# Patient Record
Sex: Female | Born: 1995 | Race: White | Hispanic: No | Marital: Single | Smoking: Current every day smoker
Health system: Southern US, Community
[De-identification: ages and names within clinical notes are randomized; demographics above are authoritative.]

## PROBLEM LIST (undated history)

## (undated) ENCOUNTER — Ambulatory Visit (HOSPITAL_COMMUNITY): Admission: EM | Source: Home / Self Care

## (undated) DIAGNOSIS — F1920 Other psychoactive substance dependence, uncomplicated: Secondary | ICD-10-CM

## (undated) DIAGNOSIS — K219 Gastro-esophageal reflux disease without esophagitis: Secondary | ICD-10-CM

## (undated) DIAGNOSIS — J45909 Unspecified asthma, uncomplicated: Secondary | ICD-10-CM

## (undated) HISTORY — PX: TONSILLECTOMY: SUR1361

---

## 2018-05-29 ENCOUNTER — Encounter (HOSPITAL_COMMUNITY): Payer: Self-pay | Admitting: *Deleted

## 2018-05-29 ENCOUNTER — Other Ambulatory Visit: Payer: Self-pay

## 2018-05-29 ENCOUNTER — Emergency Department (HOSPITAL_COMMUNITY)
Admission: EM | Admit: 2018-05-29 | Discharge: 2018-05-29 | Disposition: A | Discharge status: Home / Self Care | Payer: Self-pay | Attending: Emergency Medicine | Admitting: Emergency Medicine

## 2018-05-29 DIAGNOSIS — L02415 Cutaneous abscess of right lower limb: Secondary | ICD-10-CM | POA: Insufficient documentation

## 2018-05-29 DIAGNOSIS — L0291 Cutaneous abscess, unspecified: Secondary | ICD-10-CM

## 2018-05-29 DIAGNOSIS — F17210 Nicotine dependence, cigarettes, uncomplicated: Secondary | ICD-10-CM | POA: Insufficient documentation

## 2018-05-29 DIAGNOSIS — Z8709 Personal history of other diseases of the respiratory system: Secondary | ICD-10-CM | POA: Insufficient documentation

## 2018-05-29 HISTORY — DX: Unspecified asthma, uncomplicated: J45.909

## 2018-05-29 MED ORDER — SULFAMETHOXAZOLE-TRIMETHOPRIM 800-160 MG PO TABS: 1.0000 | ORAL_TABLET | Freq: Two times a day (BID) | ORAL | 0 refills | Status: AC

## 2018-05-29 MED ORDER — LIDOCAINE HCL 2 % IJ SOLN
5.0000 mL | Freq: Once | INTRAMUSCULAR | Status: AC
  Administered 2018-05-29: 100 mg
  Filled 2018-05-29: qty 20

## 2018-05-29 MED ORDER — BACITRACIN ZINC 500 UNIT/GM EX OINT
TOPICAL_OINTMENT | Freq: Once | CUTANEOUS | Status: AC
  Administered 2018-05-29: 16:00:00 via TOPICAL

## 2018-05-29 NOTE — ED Notes (Signed)
Patient verbalizes understanding of discharge instructions. Opportunity for questioning and answers were provided. Armband removed by staff, pt discharged from ED.  

## 2018-05-29 NOTE — ED Triage Notes (Signed)
PT reports site in RT lateral thigh started as a little pimple and continues to get bigger. Surrounding skin red and warm to touch. Pt reports a hx of skin infections but also thinks  It could be a spider bite.

## 2018-05-29 NOTE — ED Provider Notes (Signed)
[facility] EMERGENCY DEPARTMENT Provider Note   CSN: [id number] Arrival date & time: 05/29/18  1432    History   Chief Complaint Chief Complaint  Patient presents with  . Recurrent Skin Infections    HPI Margaret Mendoza is a 23 y.o. female.     The history is provided by the patient and medical records. No language interpreter was used.   Margaret Mendoza is a 23 y.o. female who presents to the Emergency Department complaining of area of redness to her right upper thigh.  She states this began as a little pimple about a week ago.  She did try to squeeze it, but not very much came out.  Over the last 2 to 3 days, the area has been progressively worsening.  She now feels like it is much more swollen and the redness around the site has grown as well.  Per triage note, patient states she thinks it could be a spider bite.  She did not say this to me.  She states that she never felt a bite or saw any insects, but when asked if it could have been a bite, she said maybe.  She believes this really began as a pimple.  She denies any fever.  She did get a tattoo to the area about a month ago.  It has been healing well without any complication.  She feels as if everything was going as expected and as had occurred with her previous tattoos.  She does report a history of skin infections and had to be hospitalized once for a abscess on her breast.  She states this was in [place].  She has had multiple abscesses on her breast and a similar area drained prior to this hospitalization as well.  She has never had an abscess in this area on her thigh before.  She has not taken any medication prior to arrival for her symptoms.  She reports being allergic to penicillins with no other allergies.  Unsure if she has had cephalosporins in the past.  Past Medical History:  Diagnosis Date  . Asthma     There are no active problems to display for this patient.   History reviewed. No pertinent surgical  history.   OB History   No obstetric history on file.      Home Medications    Prior to Admission medications   Medication Sig Start Date End Date Taking? Authorizing Provider  sulfamethoxazole-trimethoprim (BACTRIM DS) 800-160 MG tablet Take 1 tablet by mouth 2 (two) times daily for 7 days. 05/29/18 06/05/18  , Chase Picket, PA-C    Family History History reviewed. No pertinent family history.  Social History Social History   Tobacco Use  . Smoking status: Current Every Day Smoker    Packs/day: 1.00    Types: Cigarettes  . Smokeless tobacco: Never Used  Substance Use Topics  . Alcohol use: Yes    Comment: Drinks 1 time a week  . Drug use: Not Currently     Allergies   Penicillins   Review of Systems Review of Systems  Constitutional: Negative for fever.  HENT: Negative for congestion.   Respiratory: Negative for shortness of breath.   Cardiovascular: Negative for chest pain and leg swelling.  Musculoskeletal: Positive for myalgias.  Skin: Positive for color change and wound.  Neurological: Negative for weakness and numbness.     Physical Exam Updated Vital Signs BP (!) 125/92 (BP Location: Right Arm)   Pulse (!) 107  Temp 98.2 F (36.8 C) (Oral)   Resp 20   Ht 5\' 1"  (1.549 m)   LMP 05/13/2018   SpO2 99%   Physical Exam Vitals signs and nursing note reviewed.  Constitutional:      General: She is not in acute distress.    Appearance: She is well-developed.  HENT:     Head: Normocephalic and atraumatic.  Neck:     Musculoskeletal: Neck supple.  Cardiovascular:     Heart sounds: Normal heart sounds. No murmur.     Comments: Regular rate and rhythm on exam. Pulmonary:     Effort: Pulmonary effort is normal. No respiratory distress.     Breath sounds: Normal breath sounds. No wheezing or rales.  Musculoskeletal: Normal range of motion.  Skin:    General: Skin is warm and dry.     Comments: Right thigh with 1.5x1.5 cm area of induration  which is tender to the touch.  Does feel somewhat fluctuant.  There is about 1 cm of surrounding erythema as well.  No active drainage.  Neurological:     Mental Status: She is alert.      ED Treatments / Results  Labs (all labs ordered are listed, but only abnormal results are displayed) Labs Reviewed - No data to display  EKG None  Radiology No results found.  Procedures .Marland KitchenIncision and Drainage Date/Time: 05/29/2018 3:34 PM Performed by: , Chase Picket, PA-C Authorized by: , Chase Picket, PA-C   Consent:    Consent obtained:  Verbal   Consent given by:  Patient   Risks discussed:  Bleeding, incomplete drainage, pain and infection Location:    Type:  Abscess   Size:  1.5x1.5cm   Location:  Lower extremity   Lower extremity location:  Leg   Leg location:  R upper leg Pre-procedure details:    Skin preparation:  Betadine Anesthesia (see MAR for exact dosages):    Anesthesia method:  Local infiltration   Local anesthetic:  Lidocaine 2% w/o epi Procedure type:    Complexity:  Simple Procedure details:    Incision types:  Single straight   Scalpel blade:  11   Wound management:  Irrigated with saline   Drainage:  Purulent   Drainage amount:  Moderate   Packing materials:  None Post-procedure details:    Patient tolerance of procedure:  Tolerated well, no immediate complications   (including critical care time)  Medications Ordered in ED Medications  lidocaine (XYLOCAINE) 2 % (with pres) injection 100 mg (has no administration in time range)  bacitracin ointment (has no administration in time range)     Initial Impression / Assessment and Plan / ED Course  I have reviewed the triage vital signs and the nursing notes.  Pertinent labs & imaging results that were available during my care of the patient were reviewed by me and considered in my medical decision making (see chart for details).        Margaret Mendoza is a 23 y.o. female who presents to ED  for abscess requiring incision and drainage. Area is quite small, however even though it is small, feel she would benefit from getting it opened given her history of abscess requiring hospital admission in the past. I&D performed per procedure note above. Did get a fair amount of purulent discharge out.  Patient tolerated the procedure well.  Patient was prescribed Bactrim. Wound care instructions discussed. Wound check in 2-3 days. Return to ER if concern for spread of infection, increasing pain,  fevers or other concerns. All questions answered.  Final Clinical Impressions(s) / ED Diagnoses   Final diagnoses:  Abscess    ED Discharge Orders         Ordered    sulfamethoxazole-trimethoprim (BACTRIM DS) 800-160 MG tablet  2 times daily     05/29/18 1532           , Chase PicketJaime Pilcher, PA-C 05/29/18 1536    Derwood KaplanNanavati, Ankit, MD 05/31/18 2135

## 2018-05-29 NOTE — Discharge Instructions (Signed)
It was my pleasure taking care of you today!   Please take all of your antibiotics until finished. Wash wound with soap and water twice daily.   Return to the emergency department if you develop a fever, new or worsening symptoms develop, any additional concerns.

## 2018-07-31 ENCOUNTER — Other Ambulatory Visit: Payer: Self-pay

## 2018-07-31 ENCOUNTER — Emergency Department
Admission: EM | Admit: 2018-07-31 | Discharge: 2018-07-31 | Disposition: A | Discharge status: Home / Self Care | Payer: Self-pay | Attending: Emergency Medicine | Admitting: Emergency Medicine

## 2018-07-31 DIAGNOSIS — F17210 Nicotine dependence, cigarettes, uncomplicated: Secondary | ICD-10-CM | POA: Insufficient documentation

## 2018-07-31 DIAGNOSIS — R509 Fever, unspecified: Secondary | ICD-10-CM | POA: Insufficient documentation

## 2018-07-31 DIAGNOSIS — Z20828 Contact with and (suspected) exposure to other viral communicable diseases: Secondary | ICD-10-CM | POA: Insufficient documentation

## 2018-07-31 DIAGNOSIS — J45909 Unspecified asthma, uncomplicated: Secondary | ICD-10-CM | POA: Insufficient documentation

## 2018-07-31 LAB — SARS CORONAVIRUS 2 BY RT PCR (HOSPITAL ORDER, PERFORMED IN ~~LOC~~ HOSPITAL LAB): SARS Coronavirus 2: NEGATIVE

## 2018-07-31 NOTE — ED Notes (Addendum)
Pt states 1 week ago she had fever of 101.3 when checked at work.

## 2018-07-31 NOTE — ED Notes (Signed)
DC instructions discussed with patient, advised to follow up for results. Informed patient to sign up for MyChart to get results faster.

## 2018-07-31 NOTE — ED Provider Notes (Signed)
[facility] Emergency Department Provider Note __   First MD Initiated Contact with Patient 07/31/18 1528     (approximate)  I have reviewed the triage vital signs and the nursing notes.   HISTORY  Chief Complaint Needs Work Note Only    HPI Margaret Mendoza is a 23 y.o. female presents to the emergency department stating that she had a fever at work 1 week ago and as such was sent home.  Patient states that she checked her temperature with 2 different thermometers that she has at home after being told as such and both did not reveal a fever.  Patient denies any symptoms no cough shortness of breath diarrhea abdominal discomfort or any other complaints.  Patient states that her job would not have her return to work without a doctor's note which is why she presented to the emergency department today        Past Medical History:  Diagnosis Date  . Asthma     There are no active problems to display for this patient.   No past surgical history on file.  Prior to Admission medications   Not on File    Allergies Penicillins  No family history on file.  Social History Social History   Tobacco Use  . Smoking status: Current Every Day Smoker    Packs/day: 1.00    Types: Cigarettes  . Smokeless tobacco: Never Used  Substance Use Topics  . Alcohol use: Yes    Comment: Drinks 1 time a week  . Drug use: Not Currently    Review of Systems Constitutional: No fever/chills Eyes: No visual changes. ENT: No sore throat. Cardiovascular: Denies chest pain. Respiratory: Denies shortness of breath. Gastrointestinal: No abdominal pain.  No nausea, no vomiting.  No diarrhea.  No constipation. Genitourinary: Negative for dysuria. Musculoskeletal: Negative for neck pain.  Negative for back pain. Integumentary: Negative for rash. Neurological: Negative for headaches, focal weakness or numbness.   ____________________________________________   PHYSICAL  EXAM:  VITAL SIGNS: ED Triage Vitals  Enc Vitals Group     BP 07/31/18 1504 122/79     Pulse Rate 07/31/18 1504 87     Resp 07/31/18 1504 18     Temp 07/31/18 1504 98 F (36.7 C)     Temp Source 07/31/18 1504 Oral     SpO2 07/31/18 1504 96 %     Weight --      Height --      Head Circumference --      Peak Flow --      Pain Score 07/31/18 1508 0     Pain Loc --      Pain Edu? --      Excl. in [place]? --     Constitutional: Alert and oriented. Well appearing and in no acute distress. Eyes: Conjunctivae are normal. Mouth/Throat: Mucous membranes are moist. Oropharynx non-erythematous. Neck: No stridor.   Cardiovascular: Normal rate, regular rhythm. Good peripheral circulation. Grossly normal heart sounds. Respiratory: Normal respiratory effort.  No retractions. No audible wheezing. Musculoskeletal: No lower extremity tenderness nor edema. No gross deformities of extremities. Neurologic:  Normal speech and language. No gross focal neurologic deficits are appreciated.  Skin:  Skin is warm, dry and intact. No rash noted. Psychiatric: Mood and affect are normal. Speech and behavior are normal.  ____________________________________________   LABS (all labs ordered are listed, but only abnormal results are displayed)  Labs Reviewed  [facility]ONAVIR[facility] 2 ([facility])    Procedures   ____________________________________________   INITIAL IMPRESSION / MDM / ASSESSMENT AND PLAN / ED COURSE  As part of my medical decision making, I reviewed the following data within the electronic MEDICAL RECORD NUMBER      ____________________________________________  FINAL CLINICAL IMPRESSION(S) / ED DIAGNOSES  Final diagnoses:  Fever, unspecified fever cause     MEDICATIONS GIVEN DURING THIS VISIT:  Medications - No data to display   ED Discharge Orders    None      *Please note:  Margaret Mendoza was evaluated in Emergency Department on  07/31/2018 for the symptoms described in the history of present illness. She was evaluated in the context of the global COVID-19 pandemic, which necessitated consideration that the patient might be at risk for infection with the SARS-CoV-2 virus that causes COVID-19. Institutional protocols and algorithms that pertain to the evaluation of patients at risk for COVID-19 are in a state of rapid change based on information released by regulatory bodies including the CDC and federal and state organizations. These policies and algorithms were followed during the patient's care in the ED.  Some ED evaluations and interventions may be delayed as a result of limited staffing during the pandemic.*  Note:  This document was prepared using Dragon voice recognition software and may include unintentional dictation errors.   Darci CurrentBrown, [place], MD 07/31/18 1650

## 2018-07-31 NOTE — ED Triage Notes (Signed)
PT arrived via POV with reports of needing work note.  Pt states she cannot go back to work without a release stating she does not have a fever.

## 2019-03-25 ENCOUNTER — Emergency Department: Payer: Self-pay

## 2019-03-25 ENCOUNTER — Encounter: Payer: Self-pay | Admitting: Emergency Medicine

## 2019-03-25 ENCOUNTER — Other Ambulatory Visit: Payer: Self-pay

## 2019-03-25 ENCOUNTER — Emergency Department
Admission: EM | Admit: 2019-03-25 | Discharge: 2019-03-25 | Disposition: A | Discharge status: Home / Self Care | Payer: Self-pay | Attending: Student | Admitting: Student

## 2019-03-25 DIAGNOSIS — F17210 Nicotine dependence, cigarettes, uncomplicated: Secondary | ICD-10-CM | POA: Insufficient documentation

## 2019-03-25 DIAGNOSIS — J45909 Unspecified asthma, uncomplicated: Secondary | ICD-10-CM | POA: Insufficient documentation

## 2019-03-25 DIAGNOSIS — K047 Periapical abscess without sinus: Secondary | ICD-10-CM | POA: Insufficient documentation

## 2019-03-25 LAB — BASIC METABOLIC PANEL
Anion gap: 8 (ref 5–15)
BUN: 14 mg/dL (ref 6–20)
CO2: 26 mmol/L (ref 22–32)
Calcium: 8.6 mg/dL — ABNORMAL LOW (ref 8.9–10.3)
Chloride: 104 mmol/L (ref 98–111)
Creatinine, Ser: 0.67 mg/dL (ref 0.44–1.00)
GFR calc Af Amer: >60 mL/min (ref >60)
GFR calc non Af Amer: >60 mL/min (ref >60)
Glucose, Bld: 94 mg/dL (ref 70–99)
Potassium: 3.9 mmol/L (ref 3.5–5.1)
Sodium: 138 mmol/L (ref 135–145)

## 2019-03-25 LAB — CBC WITH DIFFERENTIAL/PLATELET
Abs Immature Granulocytes: 0.01 10*3/uL (ref 0.00–0.07)
Basophils Absolute: 0.1 10*3/uL (ref 0.0–0.1)
Basophils Relative: 1 %
Eosinophils Absolute: 0.1 10*3/uL (ref 0.0–0.5)
Eosinophils Relative: 2 %
HCT: 34.7 % — ABNORMAL LOW (ref 36.0–46.0)
Hemoglobin: 11.7 g/dL — ABNORMAL LOW (ref 12.0–15.0)
Immature Granulocytes: 0 %
Lymphocytes Relative: 20 %
Lymphs Abs: 1.8 10*3/uL (ref 0.7–4.0)
MCH: 30.4 pg (ref 26.0–34.0)
MCHC: 33.7 g/dL (ref 30.0–36.0)
MCV: 90.1 fL (ref 80.0–100.0)
Monocytes Absolute: 0.8 10*3/uL (ref 0.1–1.0)
Monocytes Relative: 9 %
Neutro Abs: 6.4 10*3/uL (ref 1.7–7.7)
Neutrophils Relative %: 68 %
Platelets: 311 10*3/uL (ref 150–400)
RBC: 3.85 MIL/uL — ABNORMAL LOW (ref 3.87–5.11)
RDW: 12.3 % (ref 11.5–15.5)
WBC: 9.2 10*3/uL (ref 4.0–10.5)
nRBC: 0 % (ref 0.0–0.2)

## 2019-03-25 IMAGING — CT CT MAXILLOFACIAL W/ CM
3 series · 15 of 47 positions shown, 18 images · IV contrast (omnipaque)
Comparison: None.

CLINICAL DATA: Left-sided facial swelling for 4 days. Soft tissue
swelling is increasing and now involves the orbit.

EXAM:
CT MAXILLOFACIAL WITH CONTRAST
TECHNIQUE: Multidetector CT imaging of the maxillofacial structures was
performed with intravenous contrast. Multiplanar CT image
reconstructions were also generated.
CONTRAST:  75mL OMNIPAQUE IOHEXOL 300 MG/ML  SOLN

[Series 2: max soft · axial · 0.31mm/px · z∈[-276,-164]mm · 9 of 66 slices shown, 12 images]
[im 5/66  brain]
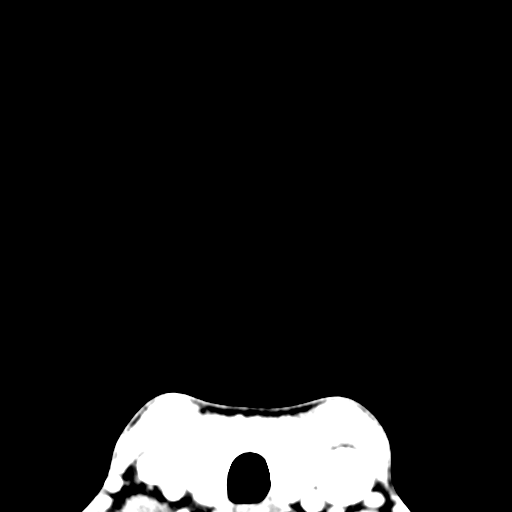
[im 5/66  bone]
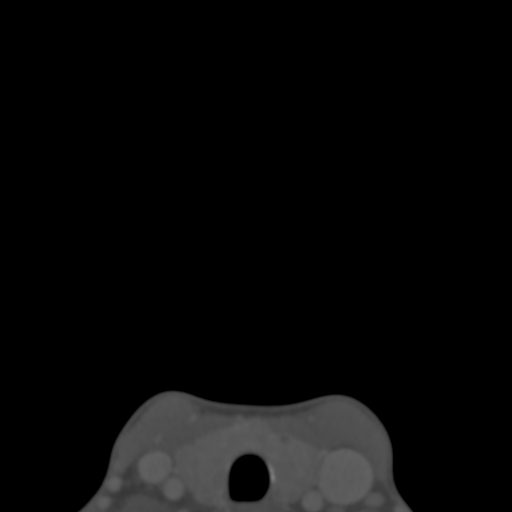
[im 12/66  bone]
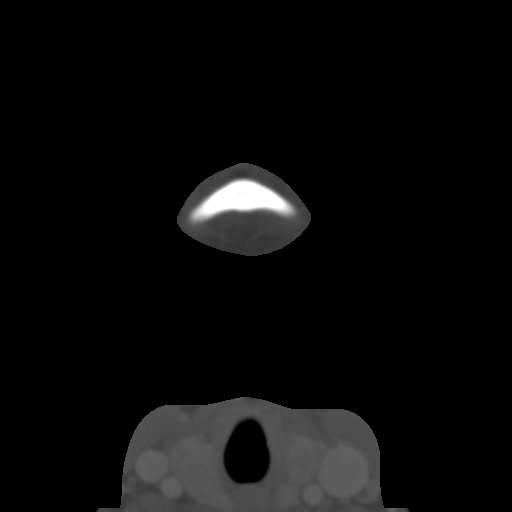
[im 18/66  bone]
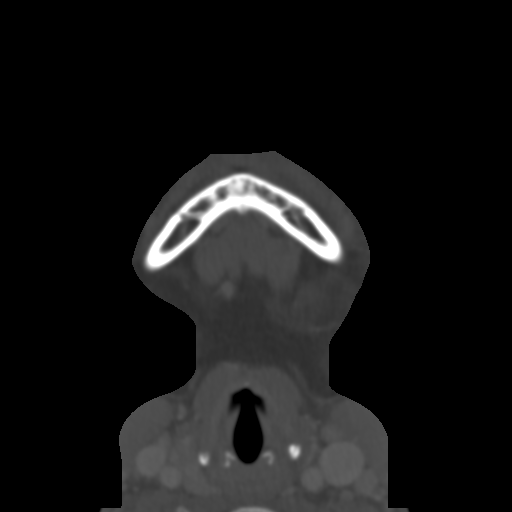
[im 25/66  bone]
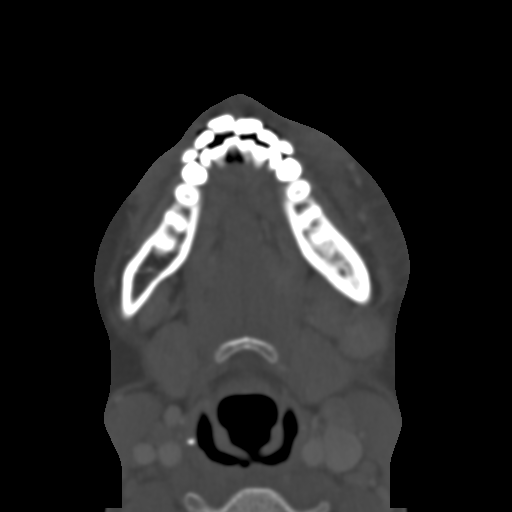
[im 34/66  brain]
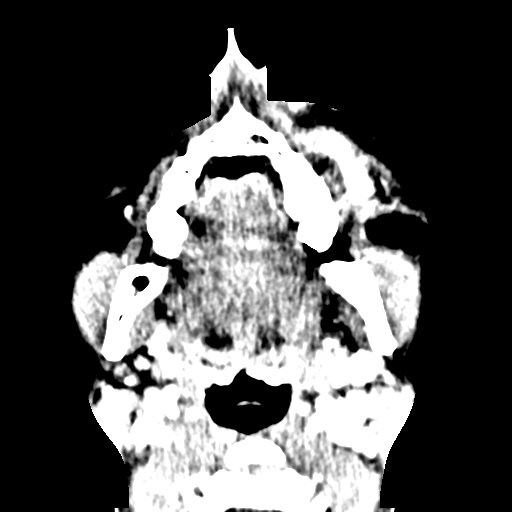
[im 34/66  bone]
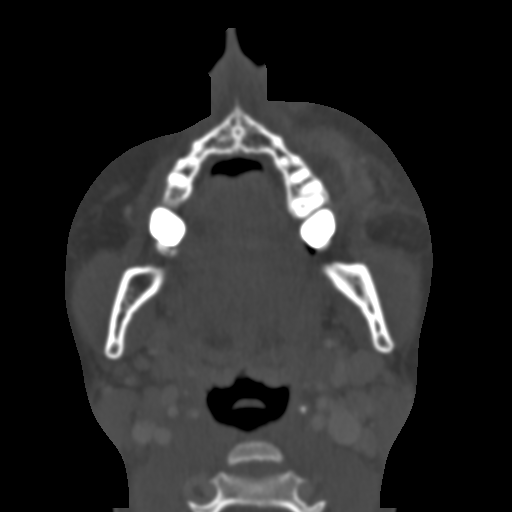
[im 41/66  bone]
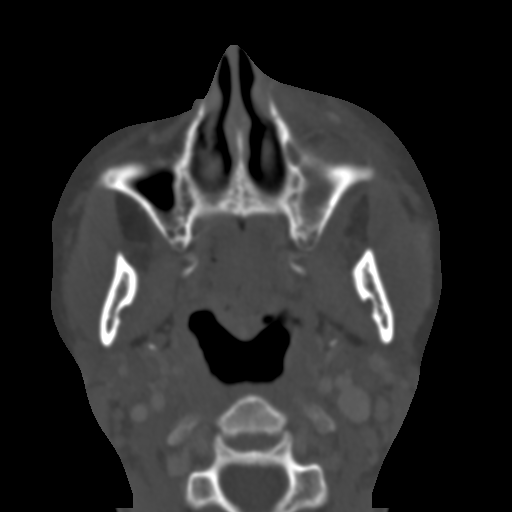
[im 48/66  bone]
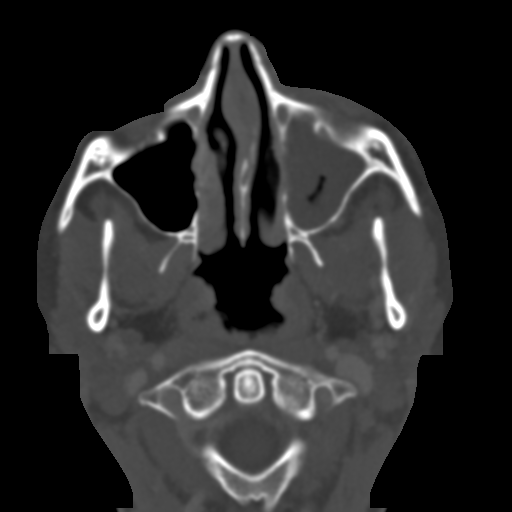
[im 54/66  bone]
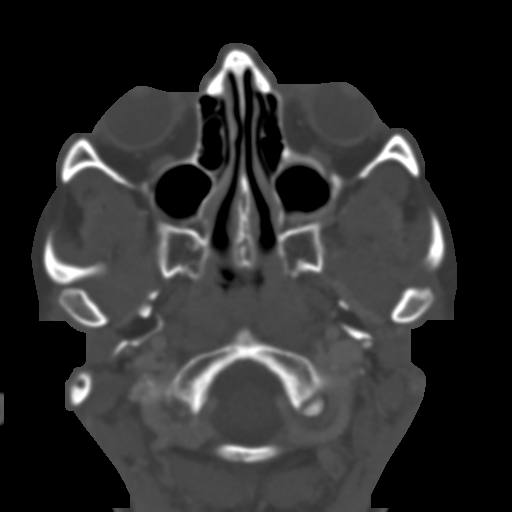
[im 61/66  brain]
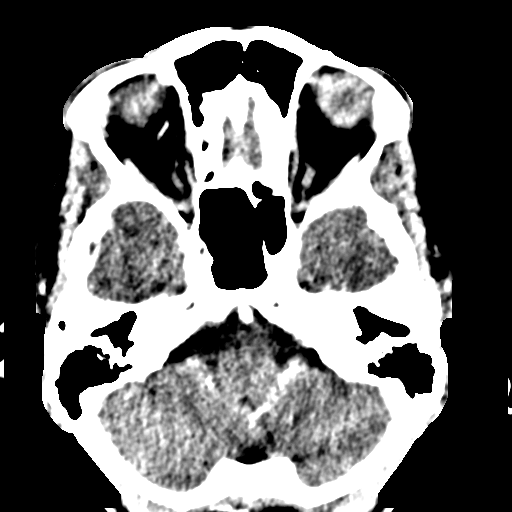
[im 61/66  bone]
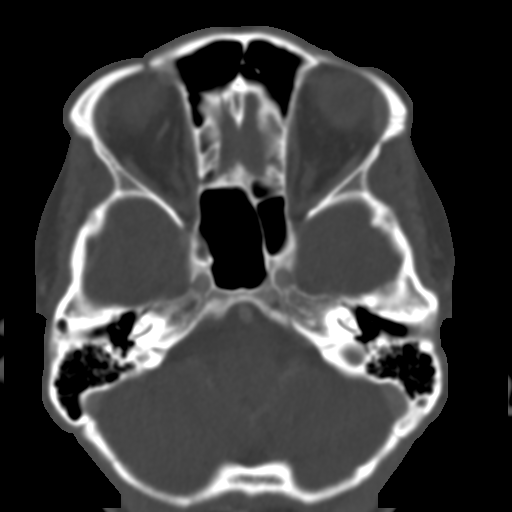

[Series 6: coronal soft · coronal · 0.27mm/px · 3 of 67 slices shown]
[im 23/67  bone]
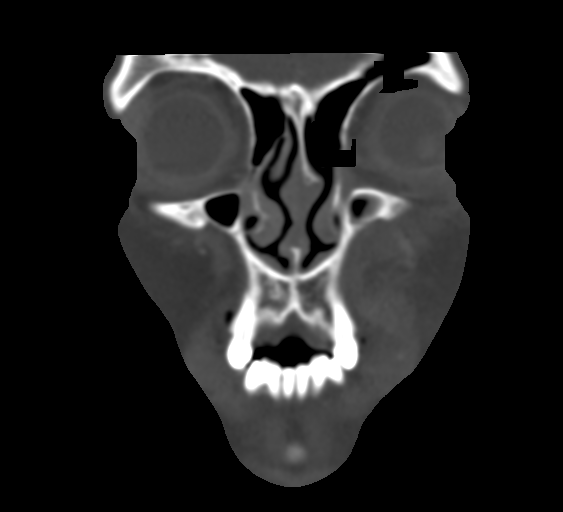
[im 30/67  bone]
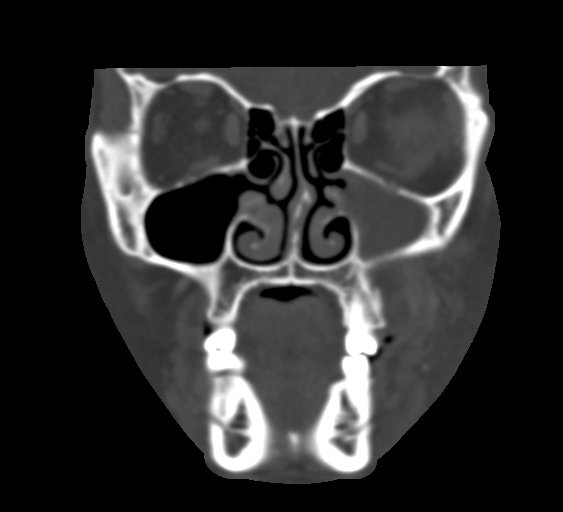
[im 37/67  bone]
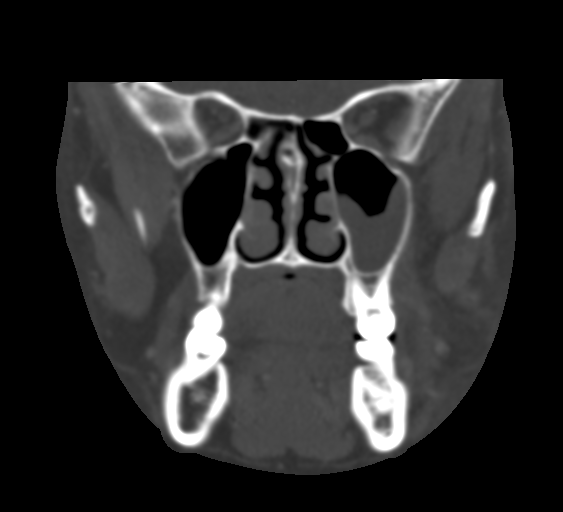

[Series 7: sagittal soft · sagittal · 0.26mm/px · 3 of 76 slices shown]
[im 26/76  bone]
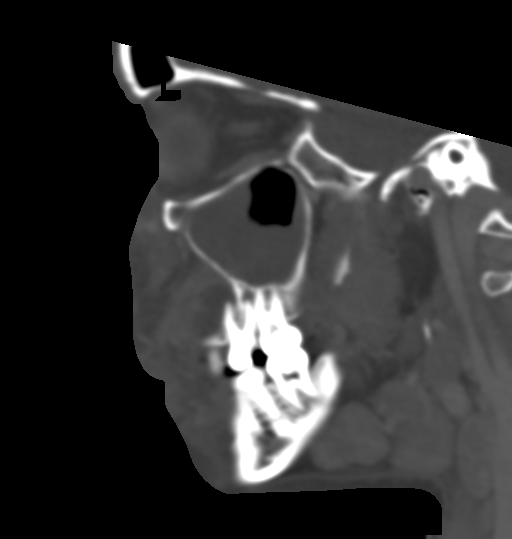
[im 38/76  bone]
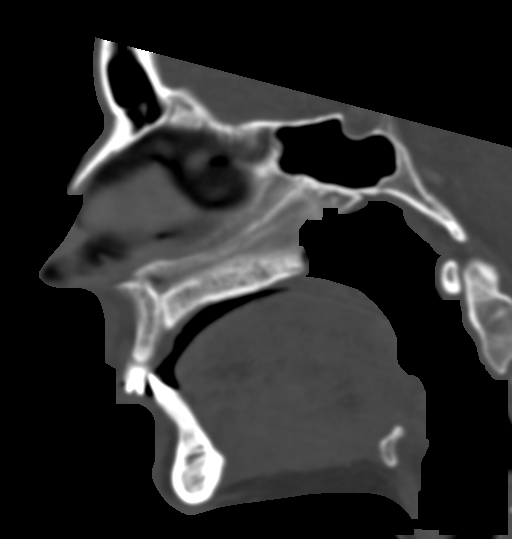
[im 51/76  bone]
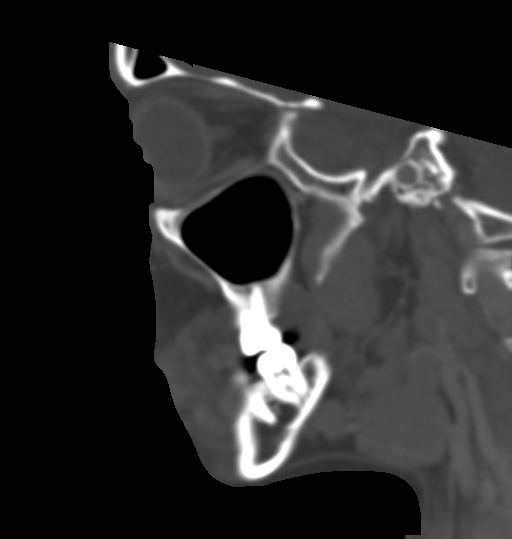

[15 of 47 positions shown; findings below may reference images not displayed]

FINDINGS: Osseous: No acute or healing fractures are present. Prominent dental
caries is present in the posterior aspect of the first left
maxillary molar. Periapical lucencies are present about both roots
with extension through the lateral cortex. Periapical lucency is
noted about the roots of the second left maxillary molar.

Orbits: Left periorbital soft tissue swelling is noted inferiorly.
No postseptal swelling is present. Globes and orbits are otherwise
within normal limits.

Sinuses: Circumferential mucosal thickening is present in the left
maxillary sinus. No fluid levels are present. The visualized
paranasal sinuses are otherwise clear.

Soft tissues: A left subperiosteal abscess is associated with the
left maxilla. The collection measures 10 x 16 x 18 mm. Extensive
surrounding soft tissue swelling is noted. This extends from the
angle the mandible superiorly to the orbit. No other discrete fluid
collection is present. Right-sided facial soft tissues are
unremarkable.

Limited intracranial: Within normal limits.
IMPRESSION: 1. Prominent dental caries involving the left first maxillary molar.
2. Associated periapical abscess with extension through the lateral
cortex of the maxilla.
3. Adjacent subperiosteal abscess measures 10 x 16 x 18 mm.
4. Diffuse surrounding soft tissue edema extending from the angle
the mandible to the inferior orbit.
5. No postseptal disease of the orbit.

## 2019-03-25 MED ORDER — CLINDAMYCIN HCL 150 MG PO CAPS: 300.0000 mg | ORAL_CAPSULE | Freq: Three times a day (TID) | ORAL | 0 refills | Status: DC

## 2019-03-25 MED ORDER — TRAMADOL HCL 50 MG PO TABS: 50.0000 mg | ORAL_TABLET | Freq: Four times a day (QID) | ORAL | 0 refills | Status: DC | PRN

## 2019-03-25 MED ORDER — SODIUM CHLORIDE 0.9 % IV BOLUS
1000.0000 mL | Freq: Once | INTRAVENOUS | Status: AC
  Administered 2019-03-25: 19:00:00 1000 mL via INTRAVENOUS

## 2019-03-25 MED ORDER — MORPHINE SULFATE (PF) 4 MG/ML IV SOLN
4.0000 mg | Freq: Once | INTRAVENOUS | Status: AC
  Administered 2019-03-25: 4 mg via INTRAVENOUS
  Filled 2019-03-25: qty 1

## 2019-03-25 MED ORDER — ONDANSETRON HCL 4 MG/2ML IJ SOLN
4.0000 mg | Freq: Once | INTRAMUSCULAR | Status: AC
  Administered 2019-03-25: 19:00:00 4 mg via INTRAVENOUS
  Filled 2019-03-25: qty 2

## 2019-03-25 MED ORDER — IOHEXOL 300 MG/ML  SOLN
75.0000 mL | Freq: Once | INTRAMUSCULAR | Status: AC | PRN
  Administered 2019-03-25: 17:00:00 75 mL via INTRAVENOUS
  Filled 2019-03-25: qty 75

## 2019-03-25 MED ORDER — CLINDAMYCIN PHOSPHATE 600 MG/50ML IV SOLN
600.0000 mg | Freq: Once | INTRAVENOUS | Status: AC
  Administered 2019-03-25: 600 mg via INTRAVENOUS
  Filled 2019-03-25: qty 50

## 2019-03-25 NOTE — ED Provider Notes (Signed)
[facility] Emergency Department Provider Note  ____________________________________________   First MD Initiated Contact with Patient 03/25/19 1535     (approximate)  I have reviewed the triage vital signs and the nursing notes.   HISTORY  Chief Complaint Facial Swelling    HPI Margaret Mendoza is a 24 y.o. female presents emergency department complaining of left-sided facial swelling that started 4 days ago.  She states the feeling in one of her left upper molars fell out last week.  She awoke today with more swelling and some pus draining from the left eye.  She denies any fever or chills.  No neck pain.    Past Medical History:  Diagnosis Date  . Asthma     There are no problems to display for this patient.   History reviewed. No pertinent surgical history.  Prior to Admission medications   Medication Sig Start Date End Date Taking? Authorizing Provider  clindamycin (CLEOCIN) 150 MG capsule Take 2 capsules (300 mg total) by mouth 3 (three) times daily. 03/25/19   Amear Strojny, Roselyn Bering, PA-C  traMADol (ULTRAM) 50 MG tablet Take 1 tablet (50 mg total) by mouth every 6 (six) hours as needed. 03/25/19   Sherrie Mustache Roselyn Bering, PA-C    Allergies Penicillins and Clindamycin/lincomycin  History reviewed. No pertinent family history.  Social History Social History   Tobacco Use  . Smoking status: Current Every Day Smoker    Packs/day: 1.00    Types: Cigarettes  . Smokeless tobacco: Never Used  Substance Use Topics  . Alcohol use: Yes    Comment: Drinks 1 time a week  . Drug use: Not Currently    Review of Systems  Constitutional: No fever/chills Eyes: No visual changes. ENT: No sore throat.  Positive for left-sided facial swelling and tooth pain Respiratory: Denies cough Cardiovascular: Denies chest pain Gastrointestinal: Denies abdominal pain Genitourinary: Negative for dysuria. Musculoskeletal: Negative for back pain. Skin: Negative for  rash. Psychiatric: no mood changes,     ____________________________________________   PHYSICAL EXAM:  VITAL SIGNS: ED Triage Vitals  Enc Vitals Group     BP 03/25/19 1455 106/78     Pulse Rate 03/25/19 1455 (!) 117     Resp 03/25/19 1455 16     Temp 03/25/19 1455 98.6 F (37 C)     Temp Source 03/25/19 1455 Oral     SpO2 03/25/19 1455 100 %     Weight 03/25/19 1453 106 lb (48.1 kg)     Height 03/25/19 1453 5\' 1"  (1.549 m)     Head Circumference --      Peak Flow --      Pain Score 03/25/19 1452 3     Pain Loc --      Pain Edu? --      Excl. in [place]? --     Constitutional: Alert and oriented. Well appearing and in no acute distress. Eyes: Conjunctivae are normal.  Head: Positive for large amount of swelling noted around the left maxillary area leading into the left eye, slight redness is noted Nose: No congestion/rhinnorhea. Mouth/Throat: Mucous membranes are moist.  Positive for poor dentition of the left upper molar Neck:  supple no lymphadenopathy noted Cardiovascular: Normal rate, regular rhythm. Heart sounds are normal Respiratory: Normal respiratory effort.  No retractions, lungs c t a  GU: deferred Musculoskeletal: FROM all extremities, warm and well perfused Neurologic:  Normal speech and language.  Skin:  Skin is warm, dry and intact. No rash noted.  Psychiatric: Mood and affect are normal. Speech and behavior are normal.  ____________________________________________   LABS (all labs ordered are listed, but only abnormal results are displayed)  Labs Reviewed  CBC WITH DIFFERENTIAL/PLATELET - Abnormal; Notable for the following components:      Result Value   RBC 3.85 (*)    Hemoglobin 11.7 (*)    HCT 34.7 (*)    All other components within normal limits  BASIC METABOLIC PANEL - Abnormal; Notable for the following components:   Calcium 8.6 (*)    All other components within normal limits  POC URINE PREG, ED    ____________________________________________   ____________________________________________  RADIOLOGY  CT maxillofacial with IV contrast shows a small abscess, soft tissue swelling noted to the periorbital area but no abscess in this area  ____________________________________________   PROCEDURES  Procedure(s) performed: Clindamycin 600 mg IV, morphine 4 mg IV, Zofran 4 mg IV, normal saline 1 L IV   Procedures    ____________________________________________   INITIAL IMPRESSION / ASSESSMENT AND PLAN / ED COURSE  Pertinent labs & imaging results that were available during my care of the patient were reviewed by me and considered in my medical decision making (see chart for details).   Patient is a 24 year old female presents with swelling to the left side of the face.  See HPI  Physical exam shows patient to appear well.  She is afebrile.  Slightly tachycardic.  Left-sided facial swelling noted with some redness extending into the left orbital area.  CBC, metabolic panel, CT maxillofacial with IV contrast  I will order IV antibiotics.  Patient states she has penicillin allergy which causes swelling.  Will await CT report to see the amount of infection and if there is indeed a abscess that needs to be drained.   CBC had normal WBC, basic metabolic panel was normal, CT backslash facial does show a small dental abscess  Explained all the findings to the patient.  We did give her clindamycin 600 mg IV, morphine 4 mg IV, Zofran 4 mg IV, and 1 L of normal saline.  She is to follow-up with one of the dental clinics to have the either the tooth removed or repaired.  Return to the emergency department if the infection is worsening.  I did give her a prescription for clindamycin and tramadol.  She was discharged in stable condition.   Margaret Mendoza was evaluated in Emergency Department on 03/25/2019 for the symptoms described in the history of present illness. She was evaluated in the  context of the global COVID-19 pandemic, which necessitated consideration that the patient might be at risk for infection with the SARS-CoV-2 virus that causes COVID-19. Institutional protocols and algorithms that pertain to the evaluation of patients at risk for COVID-19 are in a state of rapid change based on information released by regulatory bodies including the CDC and federal and state organizations. These policies and algorithms were followed during the patient's care in the ED.   As part of my medical decision making, I reviewed the following data within the electronic MEDICAL RECORD NUMBER Nursing notes reviewed and incorporated, Labs reviewed , Old chart reviewed, Radiograph reviewed , Notes from prior ED visits and [place] Controlled Substance Database  ____________________________________________   FINAL CLINICAL IMPRESSION(S) / ED DIAGNOSES  Final diagnoses:  Dental abscess      NEW MEDICATIONS STARTED DURING THIS VISIT:  New Prescriptions   CLINDAMYCIN (CLEOCIN) 150 MG CAPSULE    Take 2 capsules (300 mg total) by  mouth 3 (three) times daily.   TRAMADOL (ULTRAM) 50 MG TABLET    Take 1 tablet (50 mg total) by mouth every 6 (six) hours as needed.     Note:  This document was prepared using Dragon voice recognition software and may include unintentional dictation errors.    Versie Starks, PA-C 03/25/19 1905    Lilia Pro., MD 03/26/19 1134

## 2019-03-25 NOTE — ED Notes (Signed)
Pt presents w/ left sided facial swelling that started x 4 days ago. Pt states that the swelling was in the lower part of the face but today moved to the upper part of the face. Pt had discharge in left eye this morning and had to use washcloth to be able to open eye.

## 2019-03-25 NOTE — ED Notes (Signed)
Urine preg collected by NT NEGATIVE.

## 2019-03-25 NOTE — ED Triage Notes (Signed)
Pt to ED via POV with swelling on the left side of her face. Pt states that she has had swelling x 4 days. Pt states that the filling came out of her tooth last week and she thinks it may be related. Pt is in NAD at this time.

## 2019-03-25 NOTE — Discharge Instructions (Signed)
Follow-up with your regular doctor or one of the discounted dental clinics as listed below. Take medications as prescribed OPTIONS FOR DENTAL FOLLOW UP CARE  [place] [facility] - Local Safety Net Dental Clinics [email or web address]   [facility] [phone])  Sharl Ma [phone])  [place] [phone])  [facility] [phone])  [facility] [phone]) This clinic caters to the indigent population and is on a lottery system. Location: [employer] [facility], [employer], [address] [place] Clinic Hours: Wednesdays from 6pm - 9pm, patients seen by a lottery system. For dates, call or go to [email or web address] Services: Cleanings, fillings and simple extractions. Payment Options: DENTAL WORK IS FREE OF CHARGE. Bring proof of income or support. Best way to get seen: Arrive at 5:15 pm - this is a lottery, NOT first come/first serve, so arriving earlier will not increase your chances of being seen.     [facility] [phone] Select option 1 for emergencies   Location: [facility], [place], [address] [place] Clinic Hours: No walk-ins accepted - call the day before to schedule an appointment. Check in times are 9:30 am and 1:30 pm. Services: Simple extractions, temporary fillings, pulpectomy/pulp debridement, uncomplicated abscess drainage. Payment Options: PAYMENT IS DUE AT THE TIME OF SERVICE.  Fee is usually $100-200, additional surgical procedures (e.g. abscess drainage) may be extra. Cash, checks, Visa/MasterCard accepted.  Can file Medicaid if patient is covered for dental - patient should call case worker to check. No discount for [facility] patients. Best way to get seen: MUST call the day before and get onto the  schedule. Can usually be seen the next 1-2 days. No walk-ins accepted.     [facility] [phone]   Location: [facility], [address], [place] Clinic Hours: M, W, Th, F 8am or 1:30pm, Tues 9a or 1:30 - first come/first served. Services: Simple extractions, temporary fillings, uncomplicated abscess drainage.  You do not need to be an [facility] resident. Payment Options: PAYMENT IS DUE AT THE TIME OF SERVICE. Dental insurance, otherwise sliding scale - bring proof of income or support. Depending on income and treatment needed, cost is usually $50-200. Best way to get seen: Arrive early as it is first come/first served.     [facility] [phone]   Location: [address] Clinic Hours: Mon-Thu 8a-5p Services: Most basic dental services including extractions and fillings. Payment Options: PAYMENT IS DUE AT THE TIME OF SERVICE. Sliding scale, up to 50% off - bring proof if income or support. Medicaid with dental option accepted. Best way to get seen: Call to schedule an appointment, can usually be seen within 2 weeks OR they will try to see walk-ins - show up at 8a or 2p (you may have to wait).     [facility] [phone] [place] RESIDENTS ONLY   Location: [facility], [address], [place], [place] [address] Clinic Hours: By appointment only. Monday - Thursday 8am-5pm, Friday 8am-12pm Services: Cleanings, fillings, extractions. Payment Options: PAYMENT IS DUE AT THE TIME OF SERVICE. Cash, Visa or MasterCard. Sliding scale - $30 minimum per service. Best way to get seen: Come in to office, complete packet and make an appointment - need proof of income or support monies for each household member and proof of [facility] residence. Usually takes about a month  to get in.     [facility] [phone]    Location: [address] [facility] Clinic Hours: Walk-in Urgent Care Dental Services are offered Monday-Friday mornings only. The numbers of emergencies accepted daily is limited to the number of providers available. Maximum 15 - Mondays, Wednesdays & Thursdays Maximum 10 - Tuesdays & Fridays Services: You do not need to be a [facility] resident to be seen for a dental emergency. Emergencies are defined as pain, swelling, abnormal bleeding, or dental trauma. Walkins will receive x-rays if needed. NOTE: Dental cleaning is not an emergency. Payment Options: PAYMENT IS DUE AT THE TIME OF SERVICE. Minimum co-pay is $40.00 for uninsured patients. Minimum co-pay is $3.00 for Medicaid with dental coverage. Dental Insurance is accepted and must be presented at time of visit. Medicare does not cover dental. Forms of payment: Cash, credit card, checks. Best way to get seen: If not previously registered with the clinic, walk-in dental registration begins at 7:15 am and is on a first come/first serve basis. If previously registered with the clinic, call to make an appointment.     [facility] [phone] [place] RESIDENTS ONLY   Location: [address], [place], [place] Clinic Hours: Mon-Thu 10a-2p Services: Extractions only! Payment Options: FREE (donations accepted) - bring proof of income or support Best way to get seen: Call and schedule an appointment OR come at 8am on the 1st Monday of every month (except for holidays) when it is first come/first served.     [facility] [phone]   Location: [address] [place], [place] Clinic Hours: Friday mornings Services, Payment Options, Best way to get seen: Call for info

## 2019-03-25 NOTE — ED Notes (Signed)
Lactic sent on ice with lav/grn tubes.

## 2020-03-31 ENCOUNTER — Other Ambulatory Visit: Payer: Self-pay

## 2020-03-31 ENCOUNTER — Emergency Department
Admission: EM | Admit: 2020-03-31 | Discharge: 2020-03-31 | Disposition: A | Discharge status: Home / Self Care | Payer: Self-pay | Attending: Emergency Medicine | Admitting: Emergency Medicine

## 2020-03-31 DIAGNOSIS — Z5321 Procedure and treatment not carried out due to patient leaving prior to being seen by health care provider: Secondary | ICD-10-CM | POA: Insufficient documentation

## 2020-03-31 DIAGNOSIS — R112 Nausea with vomiting, unspecified: Secondary | ICD-10-CM | POA: Insufficient documentation

## 2020-03-31 DIAGNOSIS — R109 Unspecified abdominal pain: Secondary | ICD-10-CM | POA: Insufficient documentation

## 2020-03-31 NOTE — ED Triage Notes (Signed)
Pt states she has had n/v and abdominal pain- pt states it's been going on for about 5 days- pt denies diarrhea- pt denies chance of pregnancy

## 2020-03-31 NOTE — ED Notes (Signed)
Pt refused blood work  

## 2020-03-31 NOTE — ED Notes (Signed)
Pt to desk at this time states she is feeling better and feels like she needs to have BM.  Pt states that she wants to go home at this time.  Pt encouraged to stay for evaluation, declines.  Pt LWOT at this time.

## 2022-03-16 ENCOUNTER — Telehealth: Payer: Self-pay

## 2022-03-16 NOTE — Telephone Encounter (Signed)
Mychart msg sent

## 2022-07-17 ENCOUNTER — Ambulatory Visit: Payer: 59 | Admitting: Nurse Practitioner

## 2022-07-17 NOTE — Progress Notes (Deleted)
  Bethanie Dicker, NP-C Phone: [phone]  Margaret Mendoza is a 27 y.o. female who presents today for ***  ***  Active Ambulatory Problems    Diagnosis Date Noted   No Active Ambulatory Problems   Resolved Ambulatory Problems    Diagnosis Date Noted   No Resolved Ambulatory Problems   Past Medical History:  Diagnosis Date   Asthma     No family history on file.  Social History   Socioeconomic History   Marital status: Single    Spouse name: Not on file   Number of children: Not on file   Years of education: Not on file   Highest education level: Not on file  Occupational History   Not on file  Tobacco Use   Smoking status: Every Day    Packs/day: 1    Types: Cigarettes   Smokeless tobacco: Never  Vaping Use   Vaping Use: Never used  Substance and Sexual Activity   Alcohol use: Yes    Comment: Drinks 1 time a week   Drug use: Not Currently   Sexual activity: Not on file  Other Topics Concern   Not on file  Social History Narrative   Not on file   Social Determinants of Health   Financial Resource Strain: Not on file  Food Insecurity: Not on file  Transportation Needs: Not on file  Physical Activity: Not on file  Stress: Not on file  Social Connections: Not on file  Intimate Partner Violence: Not on file    ROS  General:  Negative for nexplained weight loss, fever Skin: Negative for new or changing mole, sore that won't heal HEENT: Negative for trouble hearing, trouble seeing, ringing in ears, mouth sores, hoarseness, change in voice, dysphagia. CV:  Negative for chest pain, dyspnea, edema, palpitations Resp: Negative for cough, dyspnea, hemoptysis GI: Negative for nausea, vomiting, diarrhea, constipation, abdominal pain, melena, hematochezia. GU: Negative for dysuria, incontinence, urinary hesitance, hematuria, vaginal or penile discharge, polyuria, sexual difficulty, lumps in testicle or breasts MSK: Negative for muscle cramps or aches, joint pain or  swelling Neuro: Negative for headaches, weakness, numbness, dizziness, passing out/fainting Psych: Negative for depression, anxiety, memory problems  Objective  Physical Exam There were no vitals filed for this visit.  BP Readings from Last 3 Encounters:  03/25/19 114/80  07/31/18 122/79  05/29/18 108/72   Wt Readings from Last 3 Encounters:  03/25/19 106 lb (48.1 kg)    Physical Exam   Assessment/Plan:   There are no diagnoses linked to this encounter.  No follow-ups on file.   Bethanie Dicker, NP-C [facility] [facility]

## 2022-10-01 ENCOUNTER — Other Ambulatory Visit: Payer: Self-pay

## 2022-10-01 ENCOUNTER — Emergency Department
Admission: EM | Admit: 2022-10-01 | Discharge: 2022-10-01 | Disposition: A | Discharge status: Home / Self Care | Payer: 59 | Attending: Emergency Medicine | Admitting: Emergency Medicine

## 2022-10-01 ENCOUNTER — Ambulatory Visit: Payer: 59

## 2022-10-01 ENCOUNTER — Emergency Department: Payer: 59

## 2022-10-01 ENCOUNTER — Encounter: Payer: Self-pay | Admitting: Emergency Medicine

## 2022-10-01 DIAGNOSIS — M7989 Other specified soft tissue disorders: Secondary | ICD-10-CM | POA: Diagnosis not present

## 2022-10-01 DIAGNOSIS — S52501A Unspecified fracture of the lower end of right radius, initial encounter for closed fracture: Secondary | ICD-10-CM | POA: Insufficient documentation

## 2022-10-01 DIAGNOSIS — Y92009 Unspecified place in unspecified non-institutional (private) residence as the place of occurrence of the external cause: Secondary | ICD-10-CM | POA: Diagnosis not present

## 2022-10-01 DIAGNOSIS — W19XXXA Unspecified fall, initial encounter: Secondary | ICD-10-CM | POA: Insufficient documentation

## 2022-10-01 DIAGNOSIS — Y93H2 Activity, gardening and landscaping: Secondary | ICD-10-CM | POA: Diagnosis not present

## 2022-10-01 DIAGNOSIS — S6991XA Unspecified injury of right wrist, hand and finger(s), initial encounter: Secondary | ICD-10-CM | POA: Diagnosis not present

## 2022-10-01 DIAGNOSIS — S52571A Other intraarticular fracture of lower end of right radius, initial encounter for closed fracture: Secondary | ICD-10-CM | POA: Diagnosis not present

## 2022-10-01 NOTE — ED Triage Notes (Signed)
Pt presents ambulatory to triage via POV with complaints of R wrist pain following a fall tonight while cutting her grass. Pt has an ace wrap on it which was placed while at home. Cap refill < 3 seconds. Denies hitting her head. A&Ox4 at this time. Denies CP or SOB.

## 2022-10-01 NOTE — Discharge Instructions (Addendum)
You were evaluated in the ED for right wrist pain following a fall.  Your x-ray revealed a fracture of the wrist bone.  You will need to follow-up with orthopedics.  Call tomorrow morning to schedule an appointment.  Keep wrist in splint until orthopedic appointment.  Elevate at night and apply ice over the affected area as needed for swelling.  You opted out of pain medication in the ED today.  Take Tylenol or ibuprofen as needed to help with pain.

## 2022-10-01 NOTE — ED Provider Notes (Signed)
[facility] Emergency Department Provider Note     Event Date/Time   First MD Initiated Contact with Patient 10/01/22 1933     (approximate)   History   Wrist Pain   HPI  Margaret Mendoza is a 27 y.o. female to the ED with right wrist injury after falling while "weed whacking" at home around 5 PM today.  Moderate swelling noted over wrist.  Severe pain.  Patient reports some tingling and range of motion is restricted due to pain.  Denies head injury or LOC.  Patient denies pain medication due to "high addiction".  No other complaints at this time.     Physical Exam   Triage Vital Signs: ED Triage Vitals  Encounter Vitals Group     BP 10/01/22 1905 (!) 131/93     Systolic BP Percentile --      Diastolic BP Percentile --      Pulse Rate 10/01/22 1905 (!) 105     Resp 10/01/22 1905 18     Temp 10/01/22 1905 98 F (36.7 C)     Temp src --      SpO2 10/01/22 1905 100 %     Weight 10/01/22 1903 110 lb (49.9 kg)     Height 10/01/22 1903 5\' 1"  (1.549 m)     Head Circumference --      Peak Flow --      Pain Score 10/01/22 1902 10     Pain Loc --      Pain Education --      Exclude from Growth Chart --     Most recent vital signs: Vitals:   10/01/22 1905  BP: (!) 131/93  Pulse: (!) 105  Resp: 18  Temp: 98 F (36.7 C)  SpO2: 100%    General Awake, no distress.  Tearful HEENT NCAT. PERRL. EOMI.  CV:  Good peripheral perfusion.  RESP:  Normal effort.  ABD:  No distention.  Other:  Right wrist reveals moderate edema.  No open wounds.  Neurovascular status intact.  Radial pulse palpated.  Limited AROM due to edema and pain.  Full PROM.  Capillary refill brisk and normal.   ED Results / Procedures / Treatments   Labs (all labs ordered are listed, but only abnormal results are displayed) Labs Reviewed - No data to display  RADIOLOGY  I personally viewed and evaluated these images as part of my medical decision making, as well as reviewing  the written report by the radiologist.  ED Provider Interpretation: Moderate soft tissue swelling can be seen on x-ray including a comminuted radial fracture at the distal portion.  DG Wrist Complete Right  Result Date: 10/01/2022 CLINICAL DATA:  Larey Seat, right wrist pain and swelling EXAM: RIGHT WRIST - COMPLETE 3+ VIEW COMPARISON:  None Available. FINDINGS: Frontal, oblique, and lateral views of the right wrist are obtained. There is a comminuted intra-articular distal right radial fracture, with impaction and dorsal angulation at the fracture site. The radiocarpal joint remains intact. Well corticated ossific density at the ulnar styloid, likely sequela from chronic trauma. Diffuse soft tissue swelling. IMPRESSION: 1. Acute comminuted intra-articular distal right radial fracture, with impaction and dorsal angulation. 2. Diffuse soft tissue swelling. 3. 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 Electronically Signed   By: Sharlet Salina M.D.   On: 10/01/2022 20:15    PROCEDURES:  Critical Care performed: No  Procedures   MEDICATIONS ORDERED IN ED: Medications - No data to display  IMPRESSION / MDM / ASSESSMENT AND PLAN / ED COURSE  I reviewed the triage vital signs and the nursing notes.                               27 y.o. female presents to the emergency department for evaluation and treatment of acute wrist pain. See HPI for further details.   Differential diagnosis includes, but is not limited to fracture, dislocation, sprain  Patient's presentation is most consistent with acute complicated illness / injury requiring diagnostic workup.  Patient is alert and oriented.  She is tearful during physical examination.  Physical exam reveals moderate edema over right dorsal aspect of wrist with severe tenderness to palpation.  The patient is neurovascularly intact.  Plan includes to place patient in sugar-tong splint with close orthopedic follow-up for further management.   Patient is opting out of pain medication due to past high addiction history.  I educated the patient on RICE therapy and emphasized elevation.  ED precautions are discussed. Patient verbalizes understanding. All questions and concerns were addressed during ED visit.     FINAL CLINICAL IMPRESSION(S) / ED DIAGNOSES   Final diagnoses:  Closed fracture of distal end of right radius, unspecified fracture morphology, initial encounter    Rx / DC Orders   ED Discharge Orders     None      Note:  This document was prepared using Dragon voice recognition software and may include unintentional dictation errors.    Romeo Apple, Demyan Fugate A, PA-C 10/01/22 2034    Janith Lima, MD 10/01/22 [phone]

## 2022-10-05 DIAGNOSIS — S52501A Unspecified fracture of the lower end of right radius, initial encounter for closed fracture: Secondary | ICD-10-CM | POA: Diagnosis not present

## 2022-10-05 DIAGNOSIS — G5601 Carpal tunnel syndrome, right upper limb: Secondary | ICD-10-CM | POA: Diagnosis not present

## 2022-10-07 ENCOUNTER — Encounter (INDEPENDENT_AMBULATORY_CARE_PROVIDER_SITE_OTHER): Payer: Self-pay

## 2022-10-08 ENCOUNTER — Encounter: Payer: Self-pay | Admitting: Orthopedic Surgery

## 2022-10-08 ENCOUNTER — Other Ambulatory Visit: Payer: Self-pay | Admitting: Orthopedic Surgery

## 2022-10-08 ENCOUNTER — Encounter: Payer: Self-pay | Admitting: Anesthesiology

## 2022-10-09 ENCOUNTER — Ambulatory Visit: Admission: RE | Admit: 2022-10-09 | Payer: 59 | Source: Home / Self Care | Admitting: Orthopedic Surgery

## 2022-10-09 DIAGNOSIS — S6291XA Unspecified fracture of right wrist and hand, initial encounter for closed fracture: Secondary | ICD-10-CM | POA: Diagnosis not present

## 2022-10-09 HISTORY — DX: Other psychoactive substance dependence, uncomplicated: F19.20

## 2022-10-09 HISTORY — DX: Gastro-esophageal reflux disease without esophagitis: K21.9

## 2022-10-09 SURGERY — OPEN REDUCTION INTERNAL FIXATION (ORIF) DISTAL RADIUS FRACTURE
Anesthesia: Choice | Laterality: Right

## 2022-11-03 DIAGNOSIS — S52501A Unspecified fracture of the lower end of right radius, initial encounter for closed fracture: Secondary | ICD-10-CM | POA: Diagnosis not present

## 2022-11-05 DIAGNOSIS — S52501D Unspecified fracture of the lower end of right radius, subsequent encounter for closed fracture with routine healing: Secondary | ICD-10-CM | POA: Diagnosis not present

## 2022-11-17 ENCOUNTER — Encounter (INDEPENDENT_AMBULATORY_CARE_PROVIDER_SITE_OTHER): Payer: Self-pay

## 2022-12-19 DIAGNOSIS — S52501A Unspecified fracture of the lower end of right radius, initial encounter for closed fracture: Secondary | ICD-10-CM | POA: Diagnosis not present

## 2022-12-29 DIAGNOSIS — S52501D Unspecified fracture of the lower end of right radius, subsequent encounter for closed fracture with routine healing: Secondary | ICD-10-CM | POA: Diagnosis not present

## 2022-12-31 DIAGNOSIS — S52501P Unspecified fracture of the lower end of right radius, subsequent encounter for closed fracture with malunion: Secondary | ICD-10-CM | POA: Diagnosis not present

## 2023-05-16 ENCOUNTER — Inpatient Hospital Stay: Admit: 2023-05-16 | Payer: 59

## 2023-11-26 ENCOUNTER — Other Ambulatory Visit: Payer: Self-pay

## 2023-11-26 ENCOUNTER — Encounter (HOSPITAL_COMMUNITY): Payer: Self-pay

## 2023-11-26 ENCOUNTER — Emergency Department (HOSPITAL_COMMUNITY)
Admission: EM | Admit: 2023-11-26 | Discharge: 2023-11-26 | Discharge status: Against Medical Advice | Attending: Emergency Medicine | Admitting: Emergency Medicine

## 2023-11-26 DIAGNOSIS — Z5321 Procedure and treatment not carried out due to patient leaving prior to being seen by health care provider: Secondary | ICD-10-CM | POA: Diagnosis not present

## 2023-11-26 DIAGNOSIS — R1031 Right lower quadrant pain: Secondary | ICD-10-CM | POA: Insufficient documentation

## 2023-11-26 DIAGNOSIS — R112 Nausea with vomiting, unspecified: Secondary | ICD-10-CM | POA: Insufficient documentation

## 2023-11-26 DIAGNOSIS — R197 Diarrhea, unspecified: Secondary | ICD-10-CM | POA: Diagnosis not present

## 2023-11-26 NOTE — ED Notes (Signed)
 Pt refusing blood work and CT scan at this time. Pt will not let RN start IV. PA notified.

## 2023-11-26 NOTE — ED Provider Triage Note (Signed)
 Emergency Medicine Provider Triage Evaluation Note  Cyniah Gossard , a 28 y.o. female  was evaluated in triage.  Pt complains of abd pain. RLQ pain x 2 days.  Endorse n/v/d.  No fever, chills, cp, sob, dysuria, hematuria, vaginal bleeding or vaginal discharge.    Review of Systems  Positive: As above Negative: As above  Physical Exam  BP 112/82 (BP Location: Right Arm)   Pulse (!) 107   Temp 97.7 F (36.5 C)   Resp 17   Ht 5' 1 (1.549 m)   Wt 45.4 kg   LMP  (LMP Unknown)   SpO2 98%   BMI 18.89 kg/m  Gen:   Awake, no distress   Resp:  Normal effort  MSK:   Moves extremities without difficulty  Other:    Medical Decision Making  Medically screening exam initiated at 2:10 PM.  Appropriate orders placed.  Solana Coggin was informed that the remainder of the evaluation will be completed by another provider, this initial triage assessment does not replace that evaluation, and the importance of remaining in the ED until their evaluation is complete.     Nivia Colon, PA-C 11/26/23 [phone]

## 2023-11-26 NOTE — ED Triage Notes (Signed)
 Pt c/o LRQ pain and has been vomiting fo r2 days, having trouble keeping anything down.

## 2024-02-03 ENCOUNTER — Encounter: Payer: Self-pay | Admitting: Obstetrics and Gynecology

## 2024-02-03 ENCOUNTER — Other Ambulatory Visit (HOSPITAL_COMMUNITY)
Admission: RE | Admit: 2024-02-03 | Discharge: 2024-02-03 | Disposition: A | Source: Ambulatory Visit | Attending: Obstetrics and Gynecology | Admitting: Obstetrics and Gynecology

## 2024-02-03 ENCOUNTER — Ambulatory Visit (INDEPENDENT_AMBULATORY_CARE_PROVIDER_SITE_OTHER): Admitting: Obstetrics and Gynecology

## 2024-02-03 VITALS — BP 122/80 | HR 114 | Wt 142.0 lb

## 2024-02-03 DIAGNOSIS — K089 Disorder of teeth and supporting structures, unspecified: Secondary | ICD-10-CM | POA: Diagnosis not present

## 2024-02-03 DIAGNOSIS — O0993 Supervision of high risk pregnancy, unspecified, third trimester: Secondary | ICD-10-CM

## 2024-02-03 DIAGNOSIS — Z3A30 30 weeks gestation of pregnancy: Secondary | ICD-10-CM | POA: Diagnosis not present

## 2024-02-03 DIAGNOSIS — O0933 Supervision of pregnancy with insufficient antenatal care, third trimester: Secondary | ICD-10-CM | POA: Diagnosis not present

## 2024-02-03 DIAGNOSIS — Z720 Tobacco use: Secondary | ICD-10-CM | POA: Insufficient documentation

## 2024-02-03 DIAGNOSIS — O0990 Supervision of high risk pregnancy, unspecified, unspecified trimester: Secondary | ICD-10-CM | POA: Insufficient documentation

## 2024-02-03 NOTE — Progress Notes (Addendum)
 New OB Note  02/03/2024   Clinic: [facility]   Chief Complaint: new OB  Transfer of Care Patient: no  History of Present Illness: Margaret Mendoza is a 29 y.o. G1P0 at 30/0 weeks (EDC 04/13/24).  Patient has a form that says on 12/31 she had a UPT at [facility] stating she has a 4/2 EDC based on her period. Patient states period is unknown but they did an u/s there and did a femur length.   Pregnancy complicated by has Supervision of high risk pregnancy, antepartum; Poor dentition; and No prenatal care in current pregnancy in third trimester on their problem list.   +FM and no PTL s/s.   ROS: A 12-point review of systems was performed and negative, except as stated in the above HPI.  OBGYN History: As per HPI. 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 No pertinent family history.  Social History:  Denies any OUD currently (see below) Down to two cigarettes per day  Social History   Socioeconomic History   Marital status: Single    Spouse name: Not on file   Number of children: Not on file   Years of education: Not on file   Highest education level: Not on file  Occupational History   Not on file  Tobacco Use   Smoking status: Every Day    Current packs/day: 1.00    Average packs/day: 1 pack/day for 9.1 years (9.1 ttl pk-yrs)    Types: Cigarettes    Start date: 01/15/2015   Smokeless tobacco: Never   Tobacco comments:    Smokes 2/cigs/day  Vaping Use   Vaping status: Never Used   Substance and Sexual Activity   Alcohol use: Not Currently    Alcohol/week: 7.0 standard drinks of alcohol    Types: 7 Standard drinks or equivalent per week    Comment: 1 DRINK DAILY   Drug use: Not Currently    Types: Marijuana, Oxycodone    Comment: Marijuana Daily. No Oxycodone or opiates since age 42(per pt0   Sexual activity: Not on file  Other Topics Concern   Not on file  Social History Narrative   Not on file   Social Drivers of Health   Tobacco Use: High Risk (02/03/2024)   Patient History    Smoking Tobacco Use: Every Day    Smokeless Tobacco Use: Never    Passive Exposure: Not on file  Financial Resource Strain: Not on file  Food Insecurity: Not on file  Transportation Needs: Not on file  Physical Activity: Not on file  Stress: Not on file  Social Connections: Not on file  Intimate Partner Violence: Not on file  Depression (EYV7-0): Not on file  Alcohol Screen: Not on file  Housing: Not on file  Utilities: Not on file  Health Literacy: Not on file  Allergy: Allergies[1]  Current Outpatient Medications: Prenatal  Physical Exam:   BP 122/80   Pulse (!) 114   Wt 142 lb (64.4 kg)   LMP  (LMP Unknown)   BMI 26.83 kg/m  Body mass index is 26.83 kg/m. Contractions: Not present Vag. Bleeding: None. Fundal height: 30 FHTs: 140s  General appearance: Well nourished, well developed female in no acute distress.  Neck:  Supple, normal appearance, and no thyromegaly  Cardiovascular: S1, S2 normal, no murmur, rub or gallop, regular rate and rhythm Respiratory:  Clear to auscultation bilateral. Normal respiratory effort Abdomen: gravd, nttp Neuro/Psych:  Normal mood and affect.  Skin:  Warm and dry.  Pelvic: declined  Laboratory: none  Imaging:  As per HPI  Assessment: patient stable  Plan: 1. [redacted] weeks gestation of pregnancy (Primary) Request for asap mfm anatomy u/s sent Pt to sign ROI to get records from [facility] PP pap -  CBC/D/Plt+RPR+Rh+ABO+RubIgG... - Culture, OB Urine - Cervicovaginal ancillary only( [place]) - US  MFM OB DETAIL +14 WK; Future - Hemoglobin A1c - Glucose tolerance, 1 hour - PANORAMA PRENATAL TEST - [place] Basic Panel  2. No prenatal care in current pregnancy in third trimester  3. Supervision of high risk pregnancy, antepartum - PANORAMA PRENATAL TEST - [place] Basic Panel  Problem list reviewed and updated.  Follow up in 2 weeks.  >50% of 35 min visit spent on counseling and coordination of care.  Return in about 2 weeks (around 02/17/2024) for in person, low risk ob, md or app. No future appointments. Bebe Izell Overcast MD Attending [facility] (Faculty Practice)     [1]  Allergies Allergen Reactions   Penicillins Hives   Clindamycin /Lincomycin Nausea And Vomiting

## 2024-02-04 ENCOUNTER — Ambulatory Visit: Payer: Self-pay | Admitting: Obstetrics and Gynecology

## 2024-02-04 DIAGNOSIS — O99013 Anemia complicating pregnancy, third trimester: Secondary | ICD-10-CM | POA: Insufficient documentation

## 2024-02-04 DIAGNOSIS — A5901 Trichomonal vulvovaginitis: Secondary | ICD-10-CM

## 2024-02-04 DIAGNOSIS — O0990 Supervision of high risk pregnancy, unspecified, unspecified trimester: Secondary | ICD-10-CM

## 2024-02-04 DIAGNOSIS — Z3490 Encounter for supervision of normal pregnancy, unspecified, unspecified trimester: Secondary | ICD-10-CM | POA: Insufficient documentation

## 2024-02-04 LAB — CBC/D/PLT+RPR+RH+ABO+RUBIGG...
Antibody Screen: NEGATIVE
Basophils Absolute: 0 x10E3/uL (ref 0.0–0.2)
Basos: 0 %
EOS (ABSOLUTE): 0.3 x10E3/uL (ref 0.0–0.4)
Eos: 2 %
HCV Ab: NONREACTIVE
HIV Screen 4th Generation wRfx: NONREACTIVE
Hematocrit: 30.2 % — ABNORMAL LOW (ref 34.0–46.6)
Hemoglobin: 10 g/dL — ABNORMAL LOW (ref 11.1–15.9)
Hepatitis B Surface Ag: NEGATIVE
Immature Grans (Abs): 0.2 x10E3/uL — ABNORMAL HIGH (ref 0.0–0.1)
Immature Granulocytes: 1 %
Lymphocytes Absolute: 1.7 x10E3/uL (ref 0.7–3.1)
Lymphs: 13 %
MCH: 28.5 pg (ref 26.6–33.0)
MCHC: 33.1 g/dL (ref 31.5–35.7)
MCV: 86 fL (ref 79–97)
Monocytes Absolute: 1.3 x10E3/uL — ABNORMAL HIGH (ref 0.1–0.9)
Monocytes: 10 %
Neutrophils Absolute: 9.1 x10E3/uL — ABNORMAL HIGH (ref 1.4–7.0)
Neutrophils: 74 %
Platelets: 331 x10E3/uL (ref 150–450)
RBC: 3.51 x10E6/uL — ABNORMAL LOW (ref 3.77–5.28)
RDW: 12.8 % (ref 11.7–15.4)
RPR Ser Ql: NONREACTIVE
Rh Factor: POSITIVE
Rubella Antibodies, IGG: <0.9 {index} — ABNORMAL LOW
WBC: 12.5 x10E3/uL — ABNORMAL HIGH (ref 3.4–10.8)

## 2024-02-04 LAB — HEMOGLOBIN A1C
Est. average glucose Bld gHb Est-mCnc: 103 mg/dL
Hgb A1c MFr Bld: 5.2 % (ref 4.8–5.6)

## 2024-02-04 LAB — GLUCOSE, 1 HOUR GESTATIONAL: Gestational Diabetes Screen: 85 mg/dL (ref 70–139)

## 2024-02-04 LAB — HCV INTERPRETATION

## 2024-02-05 LAB — CERVICOVAGINAL ANCILLARY ONLY
Chlamydia: NEGATIVE
Comment: NEGATIVE
Comment: NEGATIVE
Comment: NORMAL
Neisseria Gonorrhea: NEGATIVE
Trichomonas: POSITIVE — AB

## 2024-02-05 LAB — CULTURE, OB URINE

## 2024-02-05 LAB — URINE CULTURE, OB REFLEX: Organism ID, Bacteria: NO GROWTH

## 2024-02-06 DIAGNOSIS — A5901 Trichomonal vulvovaginitis: Secondary | ICD-10-CM | POA: Insufficient documentation

## 2024-02-06 MED ORDER — METRONIDAZOLE 500 MG PO TABS: 2000.0000 mg | ORAL_TABLET | Freq: Once | ORAL | 0 refills | Status: DC

## 2024-02-14 ENCOUNTER — Encounter: Admitting: Advanced Practice Midwife

## 2024-02-15 LAB — PANORAMA PRENATAL TEST FULL PANEL:PANORAMA TEST PLUS 5 ADDITIONAL MICRODELETIONS: FETAL FRACTION: 24.2

## 2024-02-16 ENCOUNTER — Ambulatory Visit: Admitting: Certified Nurse Midwife

## 2024-02-16 VITALS — BP 126/85 | HR 105 | Wt 148.0 lb

## 2024-02-16 DIAGNOSIS — Z3A31 31 weeks gestation of pregnancy: Secondary | ICD-10-CM

## 2024-02-16 DIAGNOSIS — O99013 Anemia complicating pregnancy, third trimester: Secondary | ICD-10-CM

## 2024-02-16 DIAGNOSIS — O0993 Supervision of high risk pregnancy, unspecified, third trimester: Secondary | ICD-10-CM

## 2024-02-16 DIAGNOSIS — A599 Trichomoniasis, unspecified: Secondary | ICD-10-CM

## 2024-02-16 MED ORDER — METRONIDAZOLE 500 MG PO TABS: 2000.0000 mg | ORAL_TABLET | Freq: Once | ORAL | 0 refills | Status: AC

## 2024-02-16 NOTE — Progress Notes (Unsigned)
 "  PRENATAL VISIT NOTE  Subjective:  Margaret Mendoza is a 29 y.o. G1P0 at [redacted]w[redacted]d being seen today for ongoing prenatal care.  She is currently monitored for the following issues for this {Blank single:19197::high-risk,low-risk} pregnancy and has Supervision of high risk pregnancy, antepartum; Poor dentition; No prenatal care in current pregnancy in third trimester; Tobacco abuse; Anemia during pregnancy in third trimester; Rubella non-immune status, antepartum; and Trichomonal vaginitis during pregnancy in third trimester on their problem list.  Patient reports {sx:14538}.  Contractions: Not present. Vag. Bleeding: None.  Movement: Present. Denies leaking of fluid.   The following portions of the patient's history were reviewed and updated as appropriate: allergies, current medications, past family history, past medical history, past social history, past surgical history and problem list.   Objective:   Vitals:   02/16/24 1051  BP: 126/85  Pulse: (!) 105  Weight: 148 lb (67.1 kg)    Fetal Status:    Fundal Height: 32 cm Movement: Present    General: Alert, oriented and cooperative. Patient is in no acute distress.  Skin: Skin is warm and dry. No rash noted.   Cardiovascular: Normal heart rate noted  Respiratory: Normal respiratory effort, no problems with respiration noted  Abdomen: Soft, gravid, appropriate for gestational age.  Pain/Pressure: Absent     Pelvic: {Blank single:19197::Cervical exam performed in the presence of a chaperone,Cervical exam deferred}        Extremities: Normal range of motion.     Mental Status: Normal mood and affect. Normal behavior. Normal judgment and thought content.      02/03/2024    3:46 PM  Depression screen PHQ 2/9  Decreased Interest 1  Down, Depressed, Hopeless 1  PHQ - 2 Score 2  Altered sleeping 1  Tired, decreased energy 2  Change in appetite 3  Feeling bad or failure about yourself  1  Trouble concentrating 0  Moving slowly or  fidgety/restless 0  Suicidal thoughts 0  PHQ-9 Score 9        02/03/2024    3:47 PM  GAD 7 : Generalized Anxiety Score  Nervous, Anxious, on Edge 0  Control/stop worrying 0  Worry too much - different things 0  Trouble relaxing 0  Restless 0  Easily annoyed or irritable 0  Afraid - awful might happen 1  Total GAD 7 Score 1    Assessment and Plan:  Pregnancy: G1P0 at [redacted]w[redacted]d 1. Supervision of high risk pregnancy in third trimester (Primary) ***  2. [redacted] weeks gestation of pregnancy ***  3. Anemia during pregnancy in third trimester *** - B12 and Folate Panel - Iron Binding Cap (TIBC)([facility]) - Ferritin  4. Trichomoniasis ***  {Blank single:19197::Term,Preterm} labor symptoms and general obstetric precautions including but not limited to vaginal bleeding, contractions, leaking of fluid and fetal movement were reviewed in detail with the patient. Please refer to After Visit Summary for other counseling recommendations.   Return for LOB.  Future Appointments  Date Time Provider Department Center  03/01/2024  2:30 PM Letha Renshaw, CNM [facility]  03/09/2024  1:00 PM [facility] PROVIDER 1 [facility]  03/09/2024  1:30 PM [facility] US5 [facility]  03/14/2024  3:30 PM Anyanwu, Gloris LABOR, MD [facility]  03/21/2024  3:30 PM Izell Harari, MD [facility]  03/28/2024  3:30 PM Constant, Winton, MD [facility]  04/05/2024  3:30 PM Emilio Delilah HERO, CNM [facility]  04/12/2024  3:30 PM Emilio Delilah HERO, CNM [facility]    Oriel Rumbold Erven)  Emilio, MSN, CNM  [facility] [employer]  02/16/2024 1:22 PM   "

## 2024-02-17 LAB — B12 AND FOLATE PANEL
Folate: >20 ng/mL
Vitamin B-12: 298 pg/mL (ref 232–1245)

## 2024-02-17 LAB — IRON AND TIBC
Iron Saturation: 6 % — CL (ref 15–55)
Iron: 35 ug/dL (ref 27–159)
Total Iron Binding Capacity: 579 ug/dL (ref 250–450)
UIBC: 544 ug/dL — ABNORMAL HIGH (ref 131–425)

## 2024-02-17 LAB — FERRITIN: Ferritin: 9 ng/mL — ABNORMAL LOW (ref 15–150)

## 2024-02-18 ENCOUNTER — Encounter: Payer: Self-pay | Admitting: Certified Nurse Midwife

## 2024-03-01 ENCOUNTER — Encounter: Admitting: Obstetrics and Gynecology

## 2024-03-09 ENCOUNTER — Ambulatory Visit

## 2024-03-09 ENCOUNTER — Other Ambulatory Visit

## 2024-03-14 ENCOUNTER — Encounter: Admitting: Obstetrics & Gynecology

## 2024-03-21 ENCOUNTER — Encounter: Admitting: Obstetrics and Gynecology

## 2024-03-28 ENCOUNTER — Encounter: Admitting: Obstetrics and Gynecology

## 2024-04-05 ENCOUNTER — Encounter: Admitting: Certified Nurse Midwife

## 2024-04-12 ENCOUNTER — Encounter: Admitting: Certified Nurse Midwife
